# Patient Record
Sex: Male | Born: 1995 | Race: White | Hispanic: No | Marital: Single | Smoking: Current every day smoker
Health system: Southern US, Community
[De-identification: ages and names within clinical notes are randomized; demographics above are authoritative.]

## PROBLEM LIST (undated history)

## (undated) HISTORY — PX: KNEE SURGERY: SHX244

---

## 2017-04-23 ENCOUNTER — Other Ambulatory Visit: Payer: Self-pay

## 2017-04-23 ENCOUNTER — Ambulatory Visit (INDEPENDENT_AMBULATORY_CARE_PROVIDER_SITE_OTHER): Payer: BLUE CROSS/BLUE SHIELD | Admitting: Physician Assistant

## 2017-04-23 ENCOUNTER — Encounter: Payer: Self-pay | Admitting: Physician Assistant

## 2017-04-23 DIAGNOSIS — F410 Panic disorder [episodic paroxysmal anxiety] without agoraphobia: Secondary | ICD-10-CM | POA: Insufficient documentation

## 2017-04-23 MED ORDER — CLONAZEPAM 0.5 MG PO TABS: 0.5000 mg | ORAL_TABLET | Freq: Two times a day (BID) | ORAL | 0 refills | Status: DC | PRN

## 2017-04-23 NOTE — Progress Notes (Signed)
Patient ID: Margarita Mailyler Lemke MRN: [id number], DOB: 02-20-1996, 22 y.o. Date of Encounter: @DATE @  Chief Complaint:  Chief Complaint  Patient presents with  . New Patient (Initial Visit)    HPI: 22 y.o. year old male  presents as a new patient to establish care.  Multiple members of his family have been seeing me as patients recently.  He reports that he has been having some issues with panic disorder that he thinks is related to recent moves. Reports that he has had no other known past medical history.  Reports that they have had to make these moves secondary to his dad's job.  Reports that he had lived his whole life in [place] until age 22. [place] age 22 they moved [place]. Now they have recently moved here.  States that around the time of the move from [place] to [place] at age 22 he started to experience anxiety and panic.  During that time he was prescribed Xanax he thinks 0.25 mg dose and at one point was prescribed Klonopin he is not sure of that dose.  States that he was never on a daily medicine such as SSRI.  States that he does not know if it has to do with trying to fit in with the other people or what it is about the moving that causes such anxiety to him.  Says that he drove all the way here from [place] and had significant anxiety during that drive.  Also states that his family had already moved here to [place] before he made the move and that while he was still in [place] after they had moved away, he felt significant anxiety.  However he states that he does not feel like he has generalized anxiety.  In general, he does not feel anxious or "on edge".States that these episodes of panic  "just come out of nowhere."  Says that they happen at least 2 or 3 times every day. States that after he was living in [place] for a while the symptoms got better.  States that he has been on no prescription medication for over 2 years.  Has not been getting any Xanax or Klonopin for over  2 years.  He is working doing Aeronautical engineerlandscaping and he is living with his parents. Discussed the fact that we have had a lot of rain and asked if he does any type of work if it is raining and he does not.  History reviewed. No pertinent past medical history.   Home Meds: No outpatient medications prior to visit.   No facility-administered medications prior to visit.     Allergies: No Known Allergies  Social History   Socioeconomic History  . Marital status: Single    Spouse name: Not on file  . Number of children: Not on file  . Years of education: Not on file  . Highest education level: Not on file  Social Needs  . Financial resource strain: Not on file  . Food insecurity - worry: Not on file  . Food insecurity - inability: Not on file  . Transportation needs - medical: Not on file  . Transportation needs - non-medical: Not on file  Occupational History  . Not on file  Tobacco Use  . Smoking status: Current Every Day Smoker    Types: E-cigarettes  . Smokeless tobacco: Never Used  Substance and Sexual Activity  . Alcohol use: No    Frequency: Never  . Drug use: No  . Sexual activity:  Not on file  Other Topics Concern  . Not on file  Social History Narrative  . Not on file    History reviewed. No pertinent family history.   Review of Systems:  See HPI for pertinent ROS. All other ROS negative.    Physical Exam: Blood pressure 124/82, pulse 87, temperature 97.7 F (36.5 C), temperature source Oral, resp. rate 18, height 6' 0.05" (1.83 m), weight 77.3 kg (170 lb 6.4 oz), SpO2 99 %., Body mass index is 23.08 kg/m. General: WNWD WM. Appears in no acute distress. Neck: Supple. No thyromegaly. No lymphadenopathy. Lungs: Clear bilaterally to auscultation without wheezes, rales, or rhonchi. Breathing is unlabored. Heart: RRR with S1 S2. No murmurs, rubs, or gallops. Musculoskeletal:  Strength and tone normal for age. Extremities/Skin: Warm and dry.  Neuro: Alert and  oriented X 3. Moves all extremities spontaneously. Gait is normal. CNII-XII grossly in tact. Psych:  Responds to questions appropriately with a normal affect.  Mood and affect are very appropriate and normal throughout visit.  He does not seem anxious or stressed during visit.     ASSESSMENT AND PLAN:  22 y.o. year old male with    1. Panic disorder Will prescribe Klonopin for him to use as needed for episodes of panic.  Discussed not to take this if he is going to need to be driving etc.   Alot of the upcoming forecast is rain--- so he will not be working -- so will be able to use this medication.   Plan follow-up visit in 2 weeks to see how this dose of medication is affecting him and how often he is needing to use it etc. - clonazePAM (KLONOPIN) 0.5 MG tablet; Take 1 tablet (0.5 mg total) by mouth 2 (two) times daily as needed for anxiety.  Dispense: 60 tablet; Refill: 0    Signed, [address] [place], [place], [facility] 04/23/2017 9:02 AM

## 2017-05-07 ENCOUNTER — Ambulatory Visit: Payer: BLUE CROSS/BLUE SHIELD | Admitting: Physician Assistant

## 2019-03-27 ENCOUNTER — Encounter (HOSPITAL_COMMUNITY): Payer: Self-pay | Admitting: Emergency Medicine

## 2019-03-27 ENCOUNTER — Emergency Department (HOSPITAL_COMMUNITY)
Admission: EM | Admit: 2019-03-27 | Discharge: 2019-03-27 | Disposition: A | Discharge status: Home / Self Care | Payer: BLUE CROSS/BLUE SHIELD | Attending: Emergency Medicine | Admitting: Emergency Medicine

## 2019-03-27 ENCOUNTER — Emergency Department (HOSPITAL_COMMUNITY): Payer: BLUE CROSS/BLUE SHIELD

## 2019-03-27 ENCOUNTER — Other Ambulatory Visit: Payer: Self-pay

## 2019-03-27 DIAGNOSIS — M549 Dorsalgia, unspecified: Secondary | ICD-10-CM | POA: Insufficient documentation

## 2019-03-27 DIAGNOSIS — F17210 Nicotine dependence, cigarettes, uncomplicated: Secondary | ICD-10-CM | POA: Insufficient documentation

## 2019-03-27 DIAGNOSIS — R519 Headache, unspecified: Secondary | ICD-10-CM | POA: Diagnosis not present

## 2019-03-27 DIAGNOSIS — S0993XA Unspecified injury of face, initial encounter: Secondary | ICD-10-CM | POA: Diagnosis not present

## 2019-03-27 DIAGNOSIS — Y9389 Activity, other specified: Secondary | ICD-10-CM | POA: Diagnosis not present

## 2019-03-27 DIAGNOSIS — Y92410 Unspecified street and highway as the place of occurrence of the external cause: Secondary | ICD-10-CM | POA: Insufficient documentation

## 2019-03-27 DIAGNOSIS — S0990XA Unspecified injury of head, initial encounter: Secondary | ICD-10-CM

## 2019-03-27 DIAGNOSIS — S60511A Abrasion of right hand, initial encounter: Secondary | ICD-10-CM | POA: Insufficient documentation

## 2019-03-27 DIAGNOSIS — S8012XA Contusion of left lower leg, initial encounter: Secondary | ICD-10-CM | POA: Insufficient documentation

## 2019-03-27 DIAGNOSIS — S0083XA Contusion of other part of head, initial encounter: Secondary | ICD-10-CM | POA: Insufficient documentation

## 2019-03-27 DIAGNOSIS — Y999 Unspecified external cause status: Secondary | ICD-10-CM | POA: Insufficient documentation

## 2019-03-27 DIAGNOSIS — S00212A Abrasion of left eyelid and periocular area, initial encounter: Secondary | ICD-10-CM | POA: Diagnosis not present

## 2019-03-27 DIAGNOSIS — M542 Cervicalgia: Secondary | ICD-10-CM | POA: Diagnosis not present

## 2019-03-27 MED ORDER — CYCLOBENZAPRINE HCL 5 MG PO TABS: 5.0000 mg | ORAL_TABLET | Freq: Three times a day (TID) | ORAL | 0 refills | Status: DC | PRN

## 2019-03-27 MED ORDER — IBUPROFEN 600 MG PO TABS: 600.0000 mg | ORAL_TABLET | Freq: Four times a day (QID) | ORAL | 0 refills | Status: DC | PRN
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
## 2019-03-27 NOTE — ED Notes (Signed)
To Rad 

## 2019-03-27 NOTE — ED Notes (Signed)
Pt ambulates heel to toe with easy gait   Conversant and cognizant   Reports "shook up"

## 2020-10-08 ENCOUNTER — Ambulatory Visit
Admission: EM | Admit: 2020-10-08 | Discharge: 2020-10-08 | Disposition: A | Discharge status: Home / Self Care | Payer: BLUE CROSS/BLUE SHIELD | Attending: Family Medicine | Admitting: Family Medicine

## 2020-10-08 ENCOUNTER — Other Ambulatory Visit: Payer: Self-pay

## 2020-10-08 ENCOUNTER — Encounter: Payer: Self-pay | Admitting: Emergency Medicine

## 2020-10-08 DIAGNOSIS — L03113 Cellulitis of right upper limb: Secondary | ICD-10-CM

## 2020-10-08 DIAGNOSIS — W57XXXA Bitten or stung by nonvenomous insect and other nonvenomous arthropods, initial encounter: Secondary | ICD-10-CM

## 2020-10-08 DIAGNOSIS — L089 Local infection of the skin and subcutaneous tissue, unspecified: Secondary | ICD-10-CM

## 2020-10-08 DIAGNOSIS — S40861A Insect bite (nonvenomous) of right upper arm, initial encounter: Secondary | ICD-10-CM

## 2020-10-08 MED ORDER — PREDNISONE 20 MG PO TABS: 40.0000 mg | ORAL_TABLET | Freq: Every day | ORAL | 0 refills | Status: AC

## 2020-10-08 MED ORDER — CEPHALEXIN 500 MG PO CAPS: 500.0000 mg | ORAL_CAPSULE | Freq: Three times a day (TID) | ORAL | 0 refills | Status: AC

## 2020-10-08 MED ORDER — TRIAMCINOLONE ACETONIDE 0.025 % EX OINT: 1.0000 "application " | TOPICAL_OINTMENT | Freq: Two times a day (BID) | CUTANEOUS | 0 refills | Status: DC

## 2020-10-08 NOTE — ED Provider Notes (Signed)
[facility]    CSN: [id number] Arrival date & time: 10/08/20  1333      History   Chief Complaint Chief Complaint  Patient presents with   Insect Bite    RFA    HPI Bobby Wyatt is a 25 y.o. male.   HPI Patient reports while sleeping in his apartment he was awakened by an insect bite in his right forearm.  He reports initially the bite injury was small slightly reddened.  As the day has progressed he has developed a large indurated masslike lesion on the forearm which is itchy, warm to touch, and tender.  He reports any prior reactions to any type of insects.  He is uncertain as to what bit him however he has been stung by wasps and bees previously and endorses that it was not a sting wound that resulted in his injury.  History reviewed. No pertinent past medical history.  Patient Active Problem List   Diagnosis Date Noted   Panic disorder 04/23/2017    Past Surgical History:  Procedure Laterality Date   KNEE SURGERY  2013-14       Home Medications    Prior to Admission medications   Medication Sig Start Date End Date Taking? Authorizing Provider  cephALEXin (KEFLEX) 500 MG capsule Take 1 capsule (500 mg total) by mouth 3 (three) times daily for 7 days. 10/08/20 10/15/20 Yes Bing Neighbors, FNP  predniSONE (DELTASONE) 20 MG tablet Take 2 tablets (40 mg total) by mouth daily with breakfast for 3 days. 10/08/20 10/11/20 Yes Bing Neighbors, FNP  triamcinolone (KENALOG) 0.025 % ointment Apply 1 application topically 2 (two) times daily. 10/08/20  Yes Bing Neighbors, FNP  cyclobenzaprine (FLEXERIL) 5 MG tablet Take 1 tablet (5 mg total) by mouth 3 (three) times daily as needed for muscle spasms. 03/27/19   Burgess Amor, PA-C  ibuprofen (ADVIL) 600 MG tablet Take 1 tablet (600 mg total) by mouth every 6 (six) hours as needed. 03/27/19   Burgess Amor, PA-C    Family History History reviewed. No pertinent family history.  Social History Social History    Tobacco Use   Smoking status: Every Day    Packs/day: 0.50    Years: 2.00    Pack years: 1.00    Types: E-cigarettes, Cigarettes   Smokeless tobacco: Never  Substance Use Topics   Alcohol use: No   Drug use: No     Allergies   Patient has no known allergies.   Review of Systems Review of Systems Pertinent negatives listed in HPI   Physical Exam Triage Vital Signs ED Triage Vitals  Enc Vitals Group     BP 10/08/20 1513 118/77     Pulse Rate 10/08/20 1513 85     Resp 10/08/20 1513 18     Temp 10/08/20 1513 98.3 F (36.8 C)     Temp Source 10/08/20 1513 Oral     SpO2 10/08/20 1513 99 %     Weight --      Height --      Head Circumference --      Peak Flow --      Pain Score 10/08/20 1514 0     Pain Loc --      Pain Edu? --      Excl. in GC? --    No data found.  Updated Vital Signs BP 118/77 (BP Location: Right Arm)   Pulse 85   Temp 98.3 F (36.8 C) (Oral)  Resp 18   SpO2 99%   Visual Acuity Right Eye Distance:   Left Eye Distance:   Bilateral Distance:    Right Eye Near:   Left Eye Near:    Bilateral Near:     Physical Exam General appearance: Alert, well developed, well nourished, cooperative  Head: Normocephalic, without obvious abnormality, atraumatic Respiratory: Respirations even and unlabored, normal respiratory rate Heart: Rate and rhythm normal.  Abdomen: BS +, no distention, no rebound tenderness, or no mass Extremities: No gross deformities Skin: Skin color, texture, turgor normal. Raised indurated skin nodule (right forearm) with expanding erythema  Psych: Appropriate mood and affect. Neurologic: GCS 15, normal coordination, normal gait   UC Treatments / Results  Labs (all labs ordered are listed, but only abnormal results are displayed) Labs Reviewed - No data to display  EKG   Radiology No results found.  Procedures Procedures (including critical care time)  Medications Ordered in UC Medications - No data to  display  Initial Impression / Assessment and Plan / UC Course  I have reviewed the triage vital signs and the nursing notes.  Pertinent labs & imaging results that were available during my care of the patient were reviewed by me and considered in my medical decision making (see chart for details).    Infected insect bite wound resulting in cellulitis Treatment per discharge instructions.   Patient advised to apply warm compresses to help resolve swelling. Return precautions given. Final Clinical Impressions(s) / UC Diagnoses   Final diagnoses:  Infected insect bite of right upper extremity, initial encounter  Cellulitis of right upper extremity     Discharge Instructions      Apply warm compresses to affected area.  Start Keflex 500 mg 3 times daily for 7 days this is to resolve any underlying infection caused by the insect bite.  Start prednisone 40 mg with breakfast tomorrow morning this reduce the swelling and inflammation secondary to the insect bite.  I also sent over triamcinolone cream which is a steroid cream this will resolve any itching associated with the insect bite.     ED Prescriptions     Medication Sig Dispense Auth. Provider   cephALEXin (KEFLEX) 500 MG capsule Take 1 capsule (500 mg total) by mouth 3 (three) times daily for 7 days. 21 capsule Bing Neighbors, FNP   predniSONE (DELTASONE) 20 MG tablet Take 2 tablets (40 mg total) by mouth daily with breakfast for 3 days. 6 tablet Bing Neighbors, FNP   triamcinolone (KENALOG) 0.025 % ointment Apply 1 application topically 2 (two) times daily. 60 g Bing Neighbors, FNP      PDMP not reviewed this encounter.   Bing Neighbors, FNP 10/08/20 [phone]

## 2020-10-08 NOTE — ED Triage Notes (Signed)
Pt presents today with c/o of pain/redness to right forearm. He reports felling something bite him during the night, but he did not see it.

## 2020-10-08 NOTE — Discharge Instructions (Addendum)
Apply warm compresses to affected area.  Start Keflex 500 mg 3 times daily for 7 days this is to resolve any underlying infection caused by the insect bite.  Start prednisone 40 mg with breakfast tomorrow morning this reduce the swelling and inflammation secondary to the insect bite.  I also sent over triamcinolone cream which is a steroid cream this will resolve any itching associated with the insect bite.

## 2021-01-25 ENCOUNTER — Telehealth: Payer: Self-pay | Admitting: Physician Assistant

## 2021-01-25 DIAGNOSIS — K047 Periapical abscess without sinus: Secondary | ICD-10-CM

## 2021-01-25 MED ORDER — IBUPROFEN 800 MG PO TABS: 800.0000 mg | ORAL_TABLET | Freq: Three times a day (TID) | ORAL | 0 refills | Status: AC | PRN

## 2021-01-25 MED ORDER — AMOXICILLIN 500 MG PO CAPS: 500.0000 mg | ORAL_CAPSULE | Freq: Three times a day (TID) | ORAL | 0 refills | Status: AC

## 2021-01-25 NOTE — Progress Notes (Signed)
Virtual Visit Consent   Bobby Wyatt, you are scheduled for a virtual visit with a [facility] provider today.     Just as with appointments in the office, your consent must be obtained to participate.  Your consent will be active for this visit and any virtual visit you may have with one of our providers in the next 365 days.     If you have a MyChart account, a copy of this consent can be sent to you electronically.  All virtual visits are billed to your insurance company just like a traditional visit in the office.    As this is a virtual visit, video technology does not allow for your provider to perform a traditional examination.  This may limit your provider's ability to fully assess your condition.  If your provider identifies any concerns that need to be evaluated in person or the need to arrange testing (such as labs, EKG, etc.), we will make arrangements to do so.     Although advances in technology are sophisticated, we cannot ensure that it will always work on either your end or our end.  If the connection with a video visit is poor, the visit may have to be switched to a telephone visit.  With either a video or telephone visit, we are not always able to ensure that we have a secure connection.     I need to obtain your verbal consent now.   Are you willing to proceed with your visit today?    Bobby Wyatt has provided verbal consent on 01/25/2021 for a virtual visit (video or telephone).   Margaretann Loveless, PA-C   Date: 01/25/2021 11:29 AM   Virtual Visit via Video Note   I, Margaretann Loveless, connected with  Bobby Wyatt  ([id number], Sep 22, 1995) on 01/25/21 at 11:30 AM EST by a video-enabled telemedicine application and verified that I am speaking with the correct person using two identifiers.  Location: Patient: Virtual Visit Location Patient: Home Provider: Virtual Visit Location Provider: Home Office   I discussed the limitations of evaluation and management by  telemedicine and the availability of in person appointments. The patient expressed understanding and agreed to proceed.    History of Present Illness: Bobby Wyatt is a 25 y.o. who identifies as a male who was assigned male at birth, and is being seen today for tooth infection.  HPI: Dental Pain  This is a new problem. The current episode started 1 to 4 weeks ago (1 week ago). The problem occurs constantly. The problem has been gradually worsening. The pain is severe. Associated symptoms include facial pain and thermal sensitivity. Pertinent negatives include no difficulty swallowing, fever or sinus pressure. He has tried nothing for the symptoms. The treatment provided no relief.  Reports has dental appt but unable to be seen until Jan 31st, reports he called 3 dentist and this was earliest appt.   Problems:  Patient Active Problem List   Diagnosis Date Noted   Panic disorder 04/23/2017    Allergies: No Known Allergies Medications:  Current Outpatient Medications:    amoxicillin (AMOXIL) 500 MG capsule, Take 1 capsule (500 mg total) by mouth 3 (three) times daily for 10 days., Disp: 30 capsule, Rfl: 0   ibuprofen (ADVIL) 800 MG tablet, Take 1 tablet (800 mg total) by mouth every 8 (eight) hours as needed., Disp: 30 tablet, Rfl: 0   cyclobenzaprine (FLEXERIL) 5 MG tablet, Take 1 tablet (5 mg total) by  mouth 3 (three) times daily as needed for muscle spasms., Disp: 15 tablet, Rfl: 0   triamcinolone (KENALOG) 0.025 % ointment, Apply 1 application topically 2 (two) times daily., Disp: 60 g, Rfl: 0  Observations/Objective: Patient is well-developed, well-nourished in no acute distress.  Resting comfortably at home.  Head is normocephalic, atraumatic.  No labored breathing.  Speech is clear and coherent with logical content.  Patient is alert and oriented at baseline.    Assessment and Plan: 1. Tooth infection - amoxicillin (AMOXIL) 500 MG capsule; Take 1 capsule (500 mg total) by mouth  3 (three) times daily for 10 days.  Dispense: 30 capsule; Refill: 0 - ibuprofen (ADVIL) 800 MG tablet; Take 1 tablet (800 mg total) by mouth every 8 (eight) hours as needed.  Dispense: 30 tablet; Refill: 0  - Amoxicillin for infection - Ibuprofen for pain - Cool compresses to jaw for swelling as needed - Tylenol between ibuprofen doses if needed - Avoid hot and cold foods/drinks - Keep scheduled appt with dentist  Follow Up Instructions: I discussed the assessment and treatment plan with the patient. The patient was provided an opportunity to ask questions and all were answered. The patient agreed with the plan and demonstrated an understanding of the instructions.  A copy of instructions were sent to the patient via MyChart unless otherwise noted below.    The patient was advised to call back or seek an in-person evaluation if the symptoms worsen or if the condition fails to improve as anticipated.  Time:  I spent 10 minutes with the patient via telehealth technology discussing the above problems/concerns.    Margaretann Loveless, PA-C

## 2021-01-25 NOTE — Patient Instructions (Signed)
Bobby Wyatt, thank you for joining Margaretann Loveless, PA-C for today's virtual visit.  While this provider is not your primary care provider (PCP), if your PCP is located in our provider database this encounter information will be shared with them immediately following your visit.  Consent: (Patient) Bobby Wyatt provided verbal consent for this virtual visit at the beginning of the encounter.  Current Medications:  Current Outpatient Medications:    amoxicillin (AMOXIL) 500 MG capsule, Take 1 capsule (500 mg total) by mouth 3 (three) times daily for 10 days., Disp: 30 capsule, Rfl: 0   ibuprofen (ADVIL) 800 MG tablet, Take 1 tablet (800 mg total) by mouth every 8 (eight) hours as needed., Disp: 30 tablet, Rfl: 0   cyclobenzaprine (FLEXERIL) 5 MG tablet, Take 1 tablet (5 mg total) by mouth 3 (three) times daily as needed for muscle spasms., Disp: 15 tablet, Rfl: 0   triamcinolone (KENALOG) 0.025 % ointment, Apply 1 application topically 2 (two) times daily., Disp: 60 g, Rfl: 0   Medications ordered in this encounter:  Meds ordered this encounter  Medications   amoxicillin (AMOXIL) 500 MG capsule    Sig: Take 1 capsule (500 mg total) by mouth 3 (three) times daily for 10 days.    Dispense:  30 capsule    Refill:  0    Order Specific Question:   Supervising Provider    Answer:   MILLER, BRIAN [[id number]]   ibuprofen (ADVIL) 800 MG tablet    Sig: Take 1 tablet (800 mg total) by mouth every 8 (eight) hours as needed.    Dispense:  30 tablet    Refill:  0    Order Specific Question:   Supervising Provider    Answer:   Hyacinth Meeker, BRIAN [[id number]]     *If you need refills on other medications prior to your next appointment, please contact your pharmacy*  Follow-Up: Call back or seek an in-person evaluation if the symptoms worsen or if the condition fails to improve as anticipated.  Other Instructions Dental Abscess A dental abscess is an area of pus in or around a tooth. It comes from an  infection. It can cause pain and other symptoms. Treatment will help with symptoms and prevent the infection from spreading. What are the causes? This condition is caused by an infection in or around the tooth. This can be from: Very bad tooth decay (cavities). A bad injury to the tooth, such as a broken or chipped tooth. What increases the risk? The risk to get an abscess is higher in males. It is also more likely in people who: Have dental decay. Have very bad gum disease. Eat sugary snacks between meals. Use tobacco. Have diabetes. Have a weak disease-fighting system (immune system). Do not brush their teeth regularly. What are the signs or symptoms? Some mild symptoms are: Tenderness. Bad breath. Fever. A sharp, sour taste in the mouth. Pain in and around the infected tooth. Worse symptoms of this condition include: Swollen neck glands. Chills. Pus draining around the tooth. Swelling and redness around the tooth, the mouth, or the face. Very bad pain in and around the tooth. The worst symptoms can include: Difficulty swallowing. Difficulty opening your mouth. Feeling like you may vomit or vomiting. How is this treated? This is treated by getting rid of the infection. Your dentist will discuss ways to do this, including: Antibiotic medicines. Antibacterial mouth rinse. An incision in the abscess to drain out the pus. A root canal. Removing  the tooth. Follow these instructions at home: Medicines Take over-the-counter and prescription medicines only as told by your dentist. If you were prescribed an antibiotic medicine, take it as told by your dentist. Do not stop taking it even if you start to feel better. If you were prescribed a gel that has numbing medicine in it, use it exactly as told. Ask your dentist if you should avoid driving or using machines while you are taking your medicine. General instructions Rinse your mouth often with salt water. To make salt water,  dissolve -1 tsp (3-6 g) of salt in 1 cup (237 mL) of warm water. Eat a soft diet while your mouth is healing. Drink enough fluid to keep your pee (urine) pale yellow. Do not apply heat to the outside of your mouth. Do not smoke or use any products that contain nicotine or tobacco. If you need help quitting, ask your dentist. Keep all follow-up visits. Prevent an abscess Brush your teeth every morning and every night. Use fluoride toothpaste. Floss your teeth each day. Get dental cleanings as often as told by your dentist. Think about getting dental sealant put on teeth that have deep holes (decay). Drink water that has fluoride in it. Most tap water has fluoride. Check the label on bottled water to see if it has fluoride in it. Drink water instead of sugary drinks. Eat healthy meals and snacks. Wear a mouth guard or face shield when you play sports. Contact a doctor if: Your pain is worse and medicine does not help. Get help right away if: You have a fever or chills. Your symptoms suddenly get worse. You have a very bad headache. You have problems breathing or swallowing. You have trouble opening your mouth. You have swelling in your neck or close to your eye. These symptoms may be an emergency. Get help right away. Call your local emergency services (911 in the [place] Do not wait to see if the symptoms will go away. Do not drive yourself to the hospital. Summary A dental abscess is an area of pus in or around a tooth. It is caused by an infection. Treatment will help with symptoms and prevent the infection from spreading. Take over-the-counter and prescription medicines only as told by your dentist. To prevent an abscess, take good care of your teeth. Brush your teeth every morning and night. Use floss every day. Get dental cleanings as often as told by your dentist. This information is not intended to replace advice given to you by your health care provider. Make sure you  discuss any questions you have with your health care provider. Document Revised: 04/19/2020 Document Reviewed: 04/19/2020 Elsevier Patient Education  2022 [employer].    If you have been instructed to have an in-person evaluation today at a local Urgent Care facility, please use the link below. It will take you to a list of all of our available [facility], including address, phone number and hours of operation. Please do not delay care.  [facility]  If you or a family member do not have a primary care provider, use the link below to schedule a visit and establish care. When you choose a [facility] primary care physician or advanced practice provider, you gain a long-term partner in health. Find a Primary Care Provider  Learn more about [facility]'s in-office and virtual care options: [facility] - Get Care Now

## 2021-03-15 ENCOUNTER — Other Ambulatory Visit: Payer: Self-pay

## 2021-03-15 ENCOUNTER — Encounter: Payer: Self-pay | Admitting: Family Medicine

## 2021-03-15 ENCOUNTER — Ambulatory Visit (INDEPENDENT_AMBULATORY_CARE_PROVIDER_SITE_OTHER): Payer: Self-pay | Admitting: Family Medicine

## 2021-03-15 VITALS — BP 118/92 | HR 93 | Temp 98.3°F | Resp 18 | Ht 75.0 in | Wt 162.0 lb

## 2021-03-15 DIAGNOSIS — R1032 Left lower quadrant pain: Secondary | ICD-10-CM

## 2021-03-15 DIAGNOSIS — K4090 Unilateral inguinal hernia, without obstruction or gangrene, not specified as recurrent: Secondary | ICD-10-CM
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
## 2021-03-16 LAB — CBC WITH DIFFERENTIAL/PLATELET
Absolute Monocytes: 285 cells/uL (ref 200–950)
Basophils Absolute: 51 cells/uL (ref 0–200)
Basophils Relative: 1.1 %
Eosinophils Absolute: 262 cells/uL (ref 15–500)
Eosinophils Relative: 5.7 %
HCT: 46.3 % (ref 38.5–50.0)
Hemoglobin: 15.8 g/dL (ref 13.2–17.1)
Lymphs Abs: 1780 cells/uL (ref 850–3900)
MCH: 29.8 pg (ref 27.0–33.0)
MCHC: 34.1 g/dL (ref 32.0–36.0)
MCV: 87.2 fL (ref 80.0–100.0)
MPV: 10 fL (ref 7.5–12.5)
Monocytes Relative: 6.2 %
Neutro Abs: 2222 cells/uL (ref 1500–7800)
Neutrophils Relative %: 48.3 %
Platelets: 205 10*3/uL (ref 140–400)
RBC: 5.31 10*6/uL (ref 4.20–5.80)
RDW: 12.7 % (ref 11.0–15.0)
Total Lymphocyte: 38.7 %
WBC: 4.6 10*3/uL (ref 3.8–10.8)

## 2021-03-16 LAB — BASIC METABOLIC PANEL WITH GFR
BUN: 12 mg/dL (ref 7–25)
CO2: 31 mmol/L (ref 20–32)
Calcium: 10.1 mg/dL (ref 8.6–10.3)
Chloride: 102 mmol/L (ref 98–110)
Creat: 0.99 mg/dL (ref 0.60–1.24)
Glucose, Bld: 100 mg/dL — ABNORMAL HIGH (ref 65–99)
Potassium: 4.5 mmol/L (ref 3.5–5.3)
Sodium: 140 mmol/L (ref 135–146)
eGFR: 109 mL/min/{1.73_m2} (ref >=60)
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
